# Patient Record
Sex: Male | Born: 1985 | Race: Black or African American | Hispanic: No | Marital: Single | Smoking: Current every day smoker
Health system: Southern US, Community
[De-identification: ages and names within clinical notes are randomized; demographics above are authoritative.]

---
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
## 2006-11-15 ENCOUNTER — Emergency Department (HOSPITAL_COMMUNITY): Admission: EM | Admit: 2006-11-15 | Discharge: 2006-11-16 | Payer: Self-pay | Admitting: Emergency Medicine

## 2008-02-24 ENCOUNTER — Emergency Department (HOSPITAL_COMMUNITY): Admission: EM | Admit: 2008-02-24 | Discharge: 2008-02-24 | Payer: Self-pay | Admitting: Emergency Medicine

## 2008-07-11 ENCOUNTER — Emergency Department (HOSPITAL_COMMUNITY): Admission: EM | Admit: 2008-07-11 | Discharge: 2008-07-11 | Payer: Self-pay | Admitting: Emergency Medicine

## 2009-04-22 ENCOUNTER — Emergency Department (HOSPITAL_COMMUNITY): Admission: EM | Admit: 2009-04-22 | Discharge: 2009-04-22 | Payer: Self-pay | Admitting: Emergency Medicine

## 2010-02-12 ENCOUNTER — Ambulatory Visit (HOSPITAL_BASED_OUTPATIENT_CLINIC_OR_DEPARTMENT_OTHER)
Admission: RE | Admit: 2010-02-12 | Discharge: 2010-02-12 | Payer: Self-pay | Source: Home / Self Care | Attending: Internal Medicine | Admitting: Internal Medicine
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
## 2010-11-16 ENCOUNTER — Emergency Department (HOSPITAL_COMMUNITY): Payer: BC Managed Care – PPO

## 2010-11-16 ENCOUNTER — Emergency Department (HOSPITAL_COMMUNITY)
Admission: EM | Admit: 2010-11-16 | Discharge: 2010-11-16 | Disposition: A | Discharge status: Home / Self Care | Payer: BC Managed Care – PPO | Attending: Emergency Medicine | Admitting: Emergency Medicine

## 2010-11-16 DIAGNOSIS — F17200 Nicotine dependence, unspecified, uncomplicated: Secondary | ICD-10-CM | POA: Insufficient documentation

## 2010-11-16 DIAGNOSIS — W219XXA Striking against or struck by unspecified sports equipment, initial encounter: Secondary | ICD-10-CM | POA: Insufficient documentation

## 2010-11-16 DIAGNOSIS — S40019A Contusion of unspecified shoulder, initial encounter: Secondary | ICD-10-CM | POA: Insufficient documentation

## 2010-11-16 DIAGNOSIS — Y9361 Activity, american tackle football: Secondary | ICD-10-CM | POA: Insufficient documentation

## 2010-11-16 DIAGNOSIS — M25519 Pain in unspecified shoulder: Secondary | ICD-10-CM | POA: Insufficient documentation

## 2010-11-16 DIAGNOSIS — M79609 Pain in unspecified limb: Secondary | ICD-10-CM | POA: Insufficient documentation

## 2011-09-02 ENCOUNTER — Emergency Department (HOSPITAL_COMMUNITY): Payer: BC Managed Care – PPO

## 2011-09-02 ENCOUNTER — Encounter (HOSPITAL_COMMUNITY): Payer: Self-pay

## 2011-09-02 ENCOUNTER — Emergency Department (HOSPITAL_COMMUNITY)
Admission: EM | Admit: 2011-09-02 | Discharge: 2011-09-02 | Disposition: A | Discharge status: Home / Self Care | Payer: BC Managed Care – PPO | Attending: Emergency Medicine | Admitting: Emergency Medicine

## 2011-09-02 DIAGNOSIS — F17200 Nicotine dependence, unspecified, uncomplicated: Secondary | ICD-10-CM | POA: Insufficient documentation

## 2011-09-02 DIAGNOSIS — IMO0002 Reserved for concepts with insufficient information to code with codable children: Secondary | ICD-10-CM | POA: Insufficient documentation

## 2011-09-02 DIAGNOSIS — Y998 Other external cause status: Secondary | ICD-10-CM | POA: Insufficient documentation

## 2011-09-02 DIAGNOSIS — Y92410 Unspecified street and highway as the place of occurrence of the external cause: Secondary | ICD-10-CM | POA: Insufficient documentation

## 2011-09-02 DIAGNOSIS — X58XXXA Exposure to other specified factors, initial encounter: Secondary | ICD-10-CM | POA: Insufficient documentation

## 2011-09-02 DIAGNOSIS — S76319A Strain of muscle, fascia and tendon of the posterior muscle group at thigh level, unspecified thigh, initial encounter: Secondary | ICD-10-CM

## 2011-09-02 DIAGNOSIS — Y9302 Activity, running: Secondary | ICD-10-CM | POA: Insufficient documentation

## 2011-09-02 MED ORDER — IBUPROFEN 400 MG PO TABS
800.0000 mg | ORAL_TABLET | Freq: Once | ORAL | Status: AC
  Administered 2011-09-02: 800 mg via ORAL
  Filled 2011-09-02: qty 2

## 2011-09-02 NOTE — ED Notes (Signed)
Pt presents with pain to L hamstring while running today.  Pt reports feeling "something pop" in his thigh.

## 2011-09-02 NOTE — ED Provider Notes (Signed)
History   This chart was scribed for Henry Chick, MD by Shari Heritage. The patient was seen in room TR11C/TR11C. Patient's care was started at 1303.     CSN: [id number]  Arrival date & time 09/02/11  1303   First MD Initiated Contact with Patient 09/02/11 1331      Chief Complaint  Patient presents with  . Leg Pain    (Consider location/radiation/quality/duration/timing/severity/associated sxs/prior treatment) Patient is a 26 y.o. male presenting with leg pain. The history is provided by the patient. No language interpreter was used.  Leg Pain  The incident occurred 3 to 5 hours ago. The incident occurred in the street. There was no injury mechanism (Patient was running). The pain is present in the left thigh. The pain is moderate. The pain has been constant since onset. He reports no foreign bodies present. He has tried nothing for the symptoms.    Henry Ward is a 26 y.o. male who presents to the Emergency Department complaining of moderate to severe left hamstring pain onset a couple of hours ago. Patient says that he was running this morning when he felt something pop in his leg. Patient denies a fall or obvious injury to left leg. Patient hasn't taken any medications for pain relief. Pain is worse with certain positions.  He is able to bear weight   History reviewed. No pertinent past medical history.  History reviewed. No pertinent past surgical history.  No family history on file.  History  Substance Use Topics  . Smoking status: Current Everyday Smoker -- 0.5 packs/day  . Smokeless tobacco: Not on file  . Alcohol Use: Yes      Review of Systems  Musculoskeletal:       Patient is positive for left leg pain.  All other systems reviewed and are negative.    Allergies  Review of patient's allergies indicates no known allergies.  Home Medications  No current outpatient prescriptions on file.  BP 118/95  Pulse 85  Temp 98.4 F (36.9 C) (Oral)  Resp 18   Ht 6' (1.829 m)  Wt 235 lb (106.595 kg)  BMI 31.87 kg/m2  SpO2 97%  Physical Exam  Nursing note and vitals reviewed. Constitutional: He is oriented to person, place, and time. He appears well-developed and well-nourished. No distress.  HENT:  Head: Normocephalic and atraumatic.  Eyes: EOM are normal. Pupils are equal, round, and reactive to light.  Neck: Neck supple. No tracheal deviation present.  Cardiovascular: Normal rate.   Pulmonary/Chest: Effort normal. No respiratory distress.  Abdominal: Soft. He exhibits no distension.  Musculoskeletal: Normal range of motion. He exhibits no edema.       Full strength with flexion of left hamstring. Able to extend. Knee exam was normal.  Neurological: He is alert and oriented to person, place, and time. No sensory deficit.  Skin: Skin is warm and dry.  Psychiatric: He has a normal mood and affect. His behavior is normal.    ED Course  Procedures (including critical care time) DIAGNOSTIC STUDIES: Oxygen Saturation is 97% on room air, adequate by my interpretation.    COORDINATION OF CARE: 1:31pm- Patient informed of current plan for treatment and evaluation and agrees with plan at this time. Will order X-ray of left femur and administer 800mg  of Ibuprofen for pain.  Labs Reviewed - No data to display  Dg Femur Left  09/02/2011  *RADIOLOGY REPORT*  Clinical Data: Pain while running  LEFT FEMUR - 2 VIEW  Comparison:  None.  Findings: There is no evidence of bone, joint, or soft tissue abnormality.  IMPRESSION: Normal left femur.  Original Report Authenticated By: Brandon Melnick, M.D.     1. Hamstring strain       MDM  Pt with likely hamstring strain, no bulging of hamstring muslce or weakness on exam to suggest rupture.  Pt treated with ibuprofen, Discharged with strict return precautions.  Pt agreeable with plan.  Given information for orthopedics followup    I personally performed the services described in this documentation,  which was scribed in my presence. The recorded information has been reviewed and considered.   Henry Chick, MD 09/03/11 1750

## 2017-10-01 ENCOUNTER — Ambulatory Visit (HOSPITAL_COMMUNITY)
Admission: EM | Admit: 2017-10-01 | Discharge: 2017-10-01 | Disposition: A | Discharge status: Home / Self Care | Payer: BLUE CROSS/BLUE SHIELD | Attending: Family Medicine | Admitting: Family Medicine

## 2017-10-01 ENCOUNTER — Encounter (HOSPITAL_COMMUNITY): Payer: Self-pay | Admitting: Emergency Medicine

## 2017-10-01 DIAGNOSIS — F17210 Nicotine dependence, cigarettes, uncomplicated: Secondary | ICD-10-CM | POA: Diagnosis not present

## 2017-10-01 DIAGNOSIS — Z113 Encounter for screening for infections with a predominantly sexual mode of transmission: Secondary | ICD-10-CM | POA: Insufficient documentation

## 2017-10-01 NOTE — Discharge Instructions (Addendum)
Declines treatment for gonorrhea and chlamydia today.  Would like to wait on results of test Urine cytology obtained HIV/ syphilis ordered today We will follow up with you regarding the results of your test If tests are positive, please abstain from sexual activity for at least 7 days and notify partners [facility] if symptoms persists Return here or go to ER if you have any new or worsening symptoms

## 2017-10-01 NOTE — ED Triage Notes (Signed)
Pt here for STD testing. No symptoms.

## 2017-10-01 NOTE — ED Provider Notes (Signed)
[facility]   [id number] 10/01/17 Arrival Time: 1631   WU:[id number]:CONCERN FOR STD  SUBJECTIVE:  Henry Ward is a 32 y.o. male who presents requesting STI screening.  Last unprotected sexual encounter last week.  Sexually active with 1 male partner.  Currently asymptomatic.  Denies fever, chills, nausea, vomiting, abdominal or urinary symptoms, penile itching, penile pain, testicular pain, testicular swelling, penile or groin rashes or lesions.   No LMP for male patient.  ROS: As per HPI.  History reviewed. No pertinent past medical history. History reviewed. No pertinent surgical history. No Known Allergies No current facility-administered medications on file prior to encounter.    No current outpatient medications on file prior to encounter.   Social History   Socioeconomic History  . Marital status: Single    Spouse name: Not on file  . Number of children: Not on file  . Years of education: Not on file  . Highest education level: Not on file  Occupational History  . Not on file  Social Needs  . Financial resource strain: Not on file  . Food insecurity:    Worry: Not on file    Inability: Not on file  . Transportation needs:    Medical: Not on file    Non-medical: Not on file  Tobacco Use  . Smoking status: Current Every Day Smoker    Packs/day: 0.50  Substance and Sexual Activity  . Alcohol use: Yes  . Drug use: No  . Sexual activity: Not on file  Lifestyle  . Physical activity:    Days per week: Not on file    Minutes per session: Not on file  . Stress: Not on file  Relationships  . Social connections:    Talks on phone: Not on file    Gets together: Not on file    Attends religious service: Not on file    Active member of club or organization: Not on file    Attends meetings of clubs or organizations: Not on file    Relationship status: Not on file  . Intimate partner violence:    Fear of current or ex partner: Not on file    Emotionally  abused: Not on file    Physically abused: Not on file    Forced sexual activity: Not on file  Other Topics Concern  . Not on file  Social History Narrative  . Not on file   No family history on file.  OBJECTIVE:  Vitals:   10/01/17 1659  BP: 124/72  Pulse: 61  Resp: 16  Temp: 97.8 F (36.6 C)  SpO2: 97%     General appearance: alert, cooperative, appears stated age and no distress Throat: lips, mucosa, and tongue normal; teeth and gums normal Lungs: CTA bilaterally without adventitious breath sounds Heart: regular rate and rhythm.  Radial pulses 2+ symmetrical bilaterally Abdomen: soft, non-tender; bowel sounds normal; no guarding GU: deferred Skin: warm and dry Psychological:  Alert and cooperative. Normal mood and affect.  No results found for this or any previous visit.  Labs Reviewed  HIV ANTIBODY (ROUTINE TESTING)  [facility]  URINE CYTOLOGY ANCILLARY ONLY    ASSESSMENT & PLAN:  1. Routine screening for STI (sexually transmitted infection)     No orders of the defined types were placed in this encounter.   Pending: Labs Reviewed  HIV ANTIBODY (ROUTINE TESTING)  [facility]  URINE CYTOLOGY ANCILLARY ONLY   Declines treatment for gonorrhea and chlamydia today.  Would like to wait on  results of tests Urine cytology obtained HIV/ syphilis ordered today We will follow up with you regarding the results of your test If tests are positive, please abstain from sexual activity for at least 7 days and notify partners Follow up with PCP or with [facility] if symptoms persists Return here or go to ER if you have any new or worsening symptoms   Reviewed expectations re: course of current medical issues. Questions answered. Outlined signs and symptoms indicating need for more acute intervention. Patient verbalized understanding. After Visit Summary given.       Rennis Harding, PA-C 10/01/17 1729

## 2017-10-02 LAB — RPR: RPR Ser Ql: NONREACTIVE

## 2017-10-02 LAB — HIV ANTIBODY (ROUTINE TESTING W REFLEX): HIV Screen 4th Generation wRfx: NONREACTIVE

## 2017-10-04 LAB — URINE CYTOLOGY ANCILLARY ONLY
Chlamydia: NEGATIVE
Neisseria Gonorrhea: NEGATIVE
Trichomonas: NEGATIVE

## 2017-11-28 ENCOUNTER — Encounter (HOSPITAL_COMMUNITY): Payer: Self-pay | Admitting: *Deleted

## 2017-11-28 ENCOUNTER — Other Ambulatory Visit: Payer: Self-pay

## 2017-11-28 ENCOUNTER — Emergency Department (HOSPITAL_COMMUNITY)
Admission: EM | Admit: 2017-11-28 | Discharge: 2017-11-28 | Disposition: A | Discharge status: Home / Self Care | Payer: BLUE CROSS/BLUE SHIELD | Attending: Emergency Medicine | Admitting: Emergency Medicine

## 2017-11-28 DIAGNOSIS — K2900 Acute gastritis without bleeding: Secondary | ICD-10-CM | POA: Diagnosis not present

## 2017-11-28 DIAGNOSIS — F17200 Nicotine dependence, unspecified, uncomplicated: Secondary | ICD-10-CM | POA: Insufficient documentation

## 2017-11-28 DIAGNOSIS — R1013 Epigastric pain: Secondary | ICD-10-CM | POA: Diagnosis present

## 2017-11-28 LAB — CBC
HCT: 48 % (ref 39.0–52.0)
Hemoglobin: 15.3 g/dL (ref 13.0–17.0)
MCH: 27.6 pg (ref 26.0–34.0)
MCHC: 31.9 g/dL (ref 30.0–36.0)
MCV: 86.5 fL (ref 80.0–100.0)
Platelets: 281 10*3/uL (ref 150–400)
RBC: 5.55 MIL/uL (ref 4.22–5.81)
RDW: 12.7 % (ref 11.5–15.5)
WBC: 6.3 10*3/uL (ref 4.0–10.5)
nRBC: 0 % (ref 0.0–0.2)

## 2017-11-28 LAB — URINALYSIS, ROUTINE W REFLEX MICROSCOPIC
Bacteria, UA: NONE SEEN
Bilirubin Urine: NEGATIVE
Glucose, UA: NEGATIVE mg/dL
Hgb urine dipstick: NEGATIVE
Ketones, ur: NEGATIVE mg/dL
Leukocytes, UA: NEGATIVE
Nitrite: NEGATIVE
Protein, ur: NEGATIVE mg/dL
Specific Gravity, Urine: 1.013 (ref 1.005–1.030)
pH: 7 (ref 5.0–8.0)

## 2017-11-28 LAB — COMPREHENSIVE METABOLIC PANEL
ALT: 49 U/L — ABNORMAL HIGH (ref 0–44)
AST: 34 U/L (ref 15–41)
Albumin: 4.1 g/dL (ref 3.5–5.0)
Alkaline Phosphatase: 54 U/L (ref 38–126)
Anion gap: 9 (ref 5–15)
BUN: 10 mg/dL (ref 6–20)
CO2: 27 mmol/L (ref 22–32)
Calcium: 10 mg/dL (ref 8.9–10.3)
Chloride: 103 mmol/L (ref 98–111)
Creatinine, Ser: 1.13 mg/dL (ref 0.61–1.24)
GFR calc Af Amer: >60 mL/min (ref >60)
GFR calc non Af Amer: >60 mL/min (ref >60)
Glucose, Bld: 93 mg/dL (ref 70–99)
Potassium: 4.3 mmol/L (ref 3.5–5.1)
Sodium: 139 mmol/L (ref 135–145)
Total Bilirubin: 1 mg/dL (ref 0.3–1.2)
Total Protein: 7 g/dL (ref 6.5–8.1)

## 2017-11-28 LAB — LIPASE, BLOOD: Lipase: 54 U/L — ABNORMAL HIGH (ref 11–51)

## 2017-11-28 MED ORDER — OMEPRAZOLE 20 MG PO CPDR: 20.0000 mg | DELAYED_RELEASE_CAPSULE | Freq: Every day | ORAL | 0 refills | Status: AC

## 2017-11-28 NOTE — ED Provider Notes (Signed)
[facility] EMERGENCY DEPARTMENT Provider Note   CSN: [id number] Arrival date & time: 11/28/17  1125     History   Chief Complaint Chief Complaint  Patient presents with  . Abdominal Pain    HPI Henry Ward is a 32 y.o. male.  HPI   32 year old male presents today with complaints of abdominal pain.  Patient notes over the last 2 months he has had epigastric abdominal pain.  He notes this is worse after eating.  He notes spicy foods and red sauce causes worsening symptoms.  He notes no associated nausea vomiting or indigestion.  He denies any dark or tarry stools.  He denies any fever.  He notes a history of ulcers previously, notes this feels similar.  He notes symptoms are improved with Tums and Pepto-Bismol.   History reviewed. No pertinent past medical history.  There are no active problems to display for this patient.   History reviewed. No pertinent surgical history.      Home Medications    Prior to Admission medications   Medication Sig Start Date End Date Taking? Authorizing Provider  omeprazole (PRILOSEC) 20 MG capsule Take 1 capsule (20 mg total) by mouth daily. 11/28/17   Eyvonne Mechanic, PA-C    Family History History reviewed. No pertinent family history.  Social History Social History   Tobacco Use  . Smoking status: Current Every Day Smoker    Packs/day: 0.50  Substance Use Topics  . Alcohol use: Yes  . Drug use: No     Allergies   Patient has no known allergies.   Review of Systems Review of Systems  All other systems reviewed and are negative.  Physical Exam Updated Vital Signs BP 134/86 (BP Location: Right Arm)   Pulse 62   Temp 97.9 F (36.6 C) (Oral)   Resp 20   SpO2 99%   Physical Exam  Constitutional: He is oriented to person, place, and time. He appears well-developed and well-nourished.  HENT:  Head: Normocephalic and atraumatic.  Eyes: Pupils are equal, round, and reactive to light.  Conjunctivae are normal. Right eye exhibits no discharge. Left eye exhibits no discharge. No scleral icterus.  Neck: Normal range of motion. No JVD present. No tracheal deviation present.  Pulmonary/Chest: Effort normal. No stridor.  Abdominal: Soft. He exhibits no distension and no mass. There is no tenderness. There is no rebound and no guarding. No hernia.  Neurological: He is alert and oriented to person, place, and time. Coordination normal.  Psychiatric: He has a normal mood and affect. His behavior is normal. Judgment and thought content normal.  Nursing note and vitals reviewed.    ED Treatments / Results  Labs (all labs ordered are listed, but only abnormal results are displayed) Labs Reviewed  LIPASE, BLOOD - Abnormal; Notable for the following components:      Result Value   Lipase 54 (*)    All other components within normal limits  COMPREHENSIVE METABOLIC PANEL - Abnormal; Notable for the following components:   ALT 49 (*)    All other components within normal limits  CBC  URINALYSIS, ROUTINE W REFLEX MICROSCOPIC    EKG None  Radiology No results found.  Procedures Procedures (including critical care time)  Medications Ordered in ED Medications - No data to display   Initial Impression / Assessment and Plan / ED Course  I have reviewed the triage vital signs and the nursing notes.  Pertinent labs & imaging results that were available  during my care of the patient were reviewed by me and considered in my medical decision making (see chart for details).     Labs: Lipase, CMP, CBC  Imaging:  Consults:  Therapeutics:  Discharge Meds: Omeprazole  Assessment/Plan: 32 year old male presents today with complaints of abdominal pain likely secondary to gastritis.  Patient will be treated with Prilosec, if discussed the need for primary care follow-up for further testing and evaluation if symptoms do not improve, strict return precautions given, he verbalized  understanding and agreement to today's plan had no further questions or concerns.   Final Clinical Impressions(s) / ED Diagnoses   Final diagnoses:  Acute gastritis without hemorrhage, unspecified gastritis type    ED Discharge Orders         Ordered    omeprazole (PRILOSEC) 20 MG capsule  Daily     11/28/17 1349           Eyvonne Mechanic, PA-C 11/28/17 1349    Vanetta Mulders, MD 11/28/17 2304

## 2017-11-28 NOTE — Discharge Instructions (Signed)
Please read attached information. If you experience any new or worsening signs or symptoms please return to the emergency room for evaluation. Please follow-up with your primary care provider or specialist as discussed. Please use medication prescribed only as directed and discontinue taking if you have any concerning signs or symptoms.   °

## 2017-11-28 NOTE — ED Triage Notes (Signed)
Pt in c/o abdominal pain that is central, symptoms for the last two months, worse after eating, symptoms getting progressively, denies n/v, no distress noted

## 2019-04-16 ENCOUNTER — Encounter (HOSPITAL_COMMUNITY): Payer: Self-pay | Admitting: Emergency Medicine

## 2019-04-16 ENCOUNTER — Other Ambulatory Visit: Payer: Self-pay

## 2019-04-16 ENCOUNTER — Emergency Department (HOSPITAL_COMMUNITY)
Admission: EM | Admit: 2019-04-16 | Discharge: 2019-04-16 | Disposition: A | Discharge status: Home / Self Care | Payer: BC Managed Care – PPO | Attending: Emergency Medicine | Admitting: Emergency Medicine

## 2019-04-16 DIAGNOSIS — M545 Low back pain, unspecified: Secondary | ICD-10-CM

## 2019-04-16 DIAGNOSIS — F17210 Nicotine dependence, cigarettes, uncomplicated: Secondary | ICD-10-CM | POA: Insufficient documentation

## 2019-04-16 DIAGNOSIS — Z79899 Other long term (current) drug therapy: Secondary | ICD-10-CM | POA: Diagnosis not present

## 2019-04-16 MED ORDER — KETOROLAC TROMETHAMINE 30 MG/ML IJ SOLN
30.0000 mg | Freq: Once | INTRAMUSCULAR | Status: AC
  Administered 2019-04-16: 16:00:00 30 mg via INTRAMUSCULAR
  Filled 2019-04-16: qty 1

## 2019-04-16 MED ORDER — IBUPROFEN 600 MG PO TABS: 600.0000 mg | ORAL_TABLET | Freq: Four times a day (QID) | ORAL | 0 refills | Status: DC | PRN

## 2019-04-16 MED ORDER — ACETAMINOPHEN 500 MG PO TABS
1000.0000 mg | ORAL_TABLET | Freq: Once | ORAL | Status: AC
  Administered 2019-04-16: 16:00:00 1000 mg via ORAL
  Filled 2019-04-16: qty 2

## 2019-04-16 MED ORDER — LIDOCAINE 5 % EX PTCH
1.0000 | MEDICATED_PATCH | CUTANEOUS | Status: DC
  Administered 2019-04-16: 16:00:00 1 via TRANSDERMAL
  Filled 2019-04-16 (×2): qty 1

## 2019-04-16 NOTE — ED Provider Notes (Signed)
[facility] EMERGENCY DEPARTMENT Provider Note   CSN: [id number] Arrival date & time: 04/16/19  1453     History Chief Complaint  Patient presents with  . Back Pain    [name] is a 34 y.o. male.  [facility] is a 34 y.o. male who is otherwise healthy, present with right sided back pain. Pain began 3 days ago when he was lifting a 225 lb weight and felt the pain when lowering the weight back to the ground. He went to urgent care and was prescribed a muscle relaxer which he took over the weekend and tried to take it easy but since then pain has worsened.  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  Pt took 400 mg ibuprofen once this morning, but has not tried anything else to treat his pain.  Patient denies any associated numbness tingling or weakness in his lower extremity, no loss of bowel or bladder control or saddle anesthesia.  No associated dysuria or urinary frequency, no hematuria.  He denies any associated abdominal pain.  No fevers or chills.  No history of cancer or IV drug use.  No other aggravating or alleviating factors.        History reviewed. No pertinent past medical history.  There are no problems to display for this patient.   History reviewed. No pertinent surgical history.     No family history on file.  Social History   Tobacco Use  . Smoking status: Current Every Day Smoker    Packs/day: 0.50  Substance Use Topics  . Alcohol use: Yes  . Drug use: No    Home Medications Prior to Admission medications   Medication Sig Start Date End Date Taking? Authorizing Provider  ibuprofen (ADVIL) 600 MG tablet Take 1 tablet (600 mg total) by mouth every 6 (six) hours as needed. 04/16/19   Dartha Lodge, PA-C  omeprazole (PRILOSEC) 20 MG capsule Take 1 capsule (20 mg total) by mouth daily. 11/28/17   Eyvonne Mechanic, PA-C     Allergies    Patient has no known allergies.  Review of Systems   Review of Systems  Constitutional: Negative for chills and fever.  HENT: Negative.   Respiratory: Negative for cough and shortness of breath.   Cardiovascular: Negative for chest pain.  Gastrointestinal: Negative for abdominal pain, constipation, diarrhea, nausea and vomiting.  Genitourinary: Negative for dysuria, flank pain, frequency and hematuria.  Musculoskeletal: Positive for back pain. Negative for arthralgias, gait problem, joint swelling, myalgias and neck pain.  Skin: Negative for color change, rash and wound.  Neurological: Negative for weakness and numbness.    Physical Exam Updated Vital Signs BP 123/80   Temp 98.6 F (37 C) (Oral)   Resp 16   Ht 6' (1.829 m)   Wt 113.4 kg   SpO2 100%   BMI 33.91 kg/m   Physical Exam Vitals and nursing note reviewed.  Constitutional:      General: He is not in acute distress.    Appearance: Normal appearance. He is well-developed and normal weight. He is not ill-appearing or diaphoretic.  HENT:     Head: Atraumatic.  Eyes:     General:        Right eye: No discharge.        Left eye: No discharge.  Cardiovascular:     Pulses:  Radial pulses are 2+ on the right side and 2+ on the left side.       Dorsalis pedis pulses are 2+ on the right side and 2+ on the left side.       Posterior tibial pulses are 2+ on the right side and 2+ on the left side.  Pulmonary:     Effort: Pulmonary effort is normal. No respiratory distress.  Abdominal:     General: Bowel sounds are normal. There is no distension.     Palpations: Abdomen is soft. There is no mass.     Tenderness: There is no abdominal tenderness. There is no guarding.     Comments: Abdomen soft, nondistended, nontender to palpation in all quadrants without guarding or peritoneal signs, no CVA tenderness bilaterally  Musculoskeletal:     Cervical back: Neck supple.     Comments: Tenderness to  palpation over right low back musculature with palpable spasm.  Pain made worse with range of motion of the lower extremities, neg straight leg raise, no radiation into lower extremities  Skin:    General: Skin is warm and dry.     Capillary Refill: Capillary refill takes less than 2 seconds.  Neurological:     Mental Status: He is alert and oriented to person, place, and time.     Comments: Alert, clear speech, following commands. Moving all extremities without difficulty. Bilateral lower extremities with 5/5 strength in proximal and distal muscle groups and with dorsi and plantar flexion. Sensation intact in bilateral lower extremities. 2+ patellar DTRs bilaterally. Ambulatory with steady gait  Psychiatric:        Mood and Affect: Mood normal.        Behavior: Behavior normal.     ED Results / Procedures / Treatments   Labs (all labs ordered are listed, but only abnormal results are displayed) Labs Reviewed - No data to display  EKG None  Radiology No results found.  Procedures Procedures (including critical care time)  Medications Ordered in ED Medications  lidocaine (LIDODERM) 5 % 1 patch (1 patch Transdermal Patch Applied 04/16/19 1627)  ketorolac (TORADOL) 30 MG/ML injection 30 mg (30 mg Intramuscular Given 04/16/19 1626)  acetaminophen (TYLENOL) tablet 1,000 mg (1,000 mg Oral Given 04/16/19 1626)    ED Course  I have reviewed the triage vital signs and the nursing notes.  Pertinent labs & imaging results that were available during my care of the patient were reviewed by me and considered in my medical decision making (see chart for details).    MDM Rules/Calculators/A&P                      Patient with back pain.  No neurological deficits and normal neuro exam.  Patient can walk but states is painful.  No loss of bowel or bladder control.  No concern for cauda equina.  No fever, night sweats, weight loss, h/o cancer, IVDU.  RICE protocol and pain medicine  indicated and discussed with patient.   Final Clinical Impression(s) / ED Diagnoses Final diagnoses:  Acute right-sided low back pain without sciatica    Rx / DC Orders ED Discharge Orders         Ordered    ibuprofen (ADVIL) 600 MG tablet  Every 6 hours PRN     04/16/19 1741           Jacqlyn Larsen, PA-C 04/16/19 1742    Isla Pence, MD 04/16/19 2318

## 2019-04-16 NOTE — Discharge Instructions (Addendum)
You were seen here today for Back Pain: Low back pain is discomfort in the lower back that may be due to injuries to muscles and ligaments around the spine. Occasionally, it may be caused by a problem to a part of the spine called a disc. Your back pain should be treated with medicines listed below as well as back exercises and this back pain should get better over the next 2 weeks. Most patients get completely well in 4 weeks. It is important to know however, if you develop severe or worsening pain, low back pain with fever, numbness, weakness or inability to walk or urinate, you should return to the ER immediately.  Please follow up with your doctor this week for a recheck if still having symptoms.  HOME INSTRUCTIONS Self - care:  The application of heat can help soothe the pain.  Maintaining your daily activities, including walking (this is encouraged), as it will help you get better faster than just staying in bed. Do not life, push, pull anything more than 10 pounds for the next week. I am attaching back exercises that you can do at home to help facilitate your recovery.   Back Exercises - I have attached a handout on back exercises that can be done at home to help facilitate your recovery.   Medications are also useful to help with pain control.   Acetaminophen.  This medication is generally safe, and found over the counter. Take as directed for your age. You should not take more than 8 of the extra strength (500mg ) pills a day (max dose is 4000mg  total OVER one day)  Non steroidal anti inflammatory: This includes medications including Ibuprofen, naproxen and Mobic; These medications help both pain and swelling and are very useful in treating back pain.  They should be taken with food, as they can cause stomach upset, and more seriously, stomach bleeding. Do not combine the medications.   Lidocaine Patch: Salon Pas lidocaine patches (blue and silver box) can be purchased over the counter and worn  for 12 hours for local pain relief   Muscle relaxants:  These medications can help with muscle tightness that is a cause of lower back pain.  Most of these medications can cause drowsiness, and it is not safe to drive or use dangerous machinery while taking them. They are primarily helpful when taken at night before sleep.  You will need to follow up with your primary healthcare provider or the Orthopedist in 1-2 weeks for reassessment and persistent symptoms.  Be aware that if you develop new symptoms, such as a fever, leg weakness, difficulty with or loss of control of your urine or bowels, abdominal pain, or more severe pain, you will need to seek medical attention and/or return to the Emergency department. Additional Information:  Your vital signs today were: BP 123/80   Temp 98.6 F (37 C) (Oral)   Resp 16   Ht 6' (1.829 m)   Wt 113.4 kg   SpO2 100%   BMI 33.91 kg/m  If your blood pressure (BP) was elevated above 135/85 this visit, please have this repeated by your doctor within one month. ---------------

## 2019-04-16 NOTE — ED Triage Notes (Signed)
Onset 3 days ago developed lower back pain while working out. Seen at Urgent Care same day and given muscle relaxer. Went to work today pain continued currently 8/10 achy sharp.

## 2021-02-26 DIAGNOSIS — G4733 Obstructive sleep apnea (adult) (pediatric): Secondary | ICD-10-CM | POA: Diagnosis not present

## 2021-03-29 DIAGNOSIS — G4733 Obstructive sleep apnea (adult) (pediatric): Secondary | ICD-10-CM | POA: Diagnosis not present

## 2021-04-26 DIAGNOSIS — G4733 Obstructive sleep apnea (adult) (pediatric): Secondary | ICD-10-CM | POA: Diagnosis not present

## 2021-05-27 DIAGNOSIS — G4733 Obstructive sleep apnea (adult) (pediatric): Secondary | ICD-10-CM | POA: Diagnosis not present

## 2021-06-26 DIAGNOSIS — G4733 Obstructive sleep apnea (adult) (pediatric): Secondary | ICD-10-CM | POA: Diagnosis not present

## 2021-07-08 ENCOUNTER — Emergency Department (HOSPITAL_COMMUNITY)
Admission: EM | Admit: 2021-07-08 | Discharge: 2021-07-09 | Discharge status: Against Medical Advice | Payer: BC Managed Care – PPO | Attending: Student | Admitting: Student

## 2021-07-08 ENCOUNTER — Other Ambulatory Visit: Payer: Self-pay

## 2021-07-08 ENCOUNTER — Emergency Department (HOSPITAL_COMMUNITY): Payer: BC Managed Care – PPO

## 2021-07-08 ENCOUNTER — Encounter (HOSPITAL_COMMUNITY): Payer: Self-pay | Admitting: Emergency Medicine

## 2021-07-08 DIAGNOSIS — M546 Pain in thoracic spine: Secondary | ICD-10-CM | POA: Diagnosis not present

## 2021-07-08 DIAGNOSIS — R519 Headache, unspecified: Secondary | ICD-10-CM | POA: Diagnosis not present

## 2021-07-08 DIAGNOSIS — M545 Low back pain, unspecified: Secondary | ICD-10-CM | POA: Insufficient documentation

## 2021-07-08 DIAGNOSIS — Z5321 Procedure and treatment not carried out due to patient leaving prior to being seen by health care provider: Secondary | ICD-10-CM | POA: Insufficient documentation

## 2021-07-08 DIAGNOSIS — M542 Cervicalgia: Secondary | ICD-10-CM | POA: Diagnosis not present

## 2021-07-08 DIAGNOSIS — S3992XA Unspecified injury of lower back, initial encounter: Secondary | ICD-10-CM | POA: Diagnosis not present

## 2021-07-08 DIAGNOSIS — Y92410 Unspecified street and highway as the place of occurrence of the external cause: Secondary | ICD-10-CM | POA: Insufficient documentation
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
## 2021-07-09 ENCOUNTER — Emergency Department (HOSPITAL_COMMUNITY): Payer: BC Managed Care – PPO

## 2021-07-09 ENCOUNTER — Encounter (HOSPITAL_COMMUNITY): Payer: Self-pay | Admitting: Emergency Medicine

## 2021-07-09 ENCOUNTER — Other Ambulatory Visit: Payer: Self-pay

## 2021-07-09 ENCOUNTER — Emergency Department (HOSPITAL_COMMUNITY)
Admission: EM | Admit: 2021-07-09 | Discharge: 2021-07-09 | Disposition: A | Discharge status: Home / Self Care | Payer: BC Managed Care – PPO | Source: Home / Self Care | Attending: Emergency Medicine | Admitting: Emergency Medicine

## 2021-07-09 DIAGNOSIS — M546 Pain in thoracic spine: Secondary | ICD-10-CM | POA: Insufficient documentation

## 2021-07-09 DIAGNOSIS — S3992XA Unspecified injury of lower back, initial encounter: Secondary | ICD-10-CM | POA: Diagnosis not present

## 2021-07-09 DIAGNOSIS — Y92410 Unspecified street and highway as the place of occurrence of the external cause: Secondary | ICD-10-CM | POA: Insufficient documentation

## 2021-07-09 DIAGNOSIS — M545 Low back pain, unspecified: Secondary | ICD-10-CM | POA: Insufficient documentation

## 2021-07-09 MED ORDER — IBUPROFEN 800 MG PO TABS: 800.0000 mg | ORAL_TABLET | Freq: Three times a day (TID) | ORAL | 0 refills | Status: AC

## 2021-07-09 MED ORDER — LIDOCAINE 5 % EX PTCH: 1.0000 | MEDICATED_PATCH | CUTANEOUS | 0 refills | Status: AC
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
## 2021-07-09 NOTE — Discharge Instructions (Addendum)
To take ibuprofen every 8 hours as needed for pain.  Use the Lidoderm patch for support.
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
## 2021-07-15 DIAGNOSIS — Z720 Tobacco use: Secondary | ICD-10-CM | POA: Diagnosis not present

## 2021-07-15 DIAGNOSIS — M549 Dorsalgia, unspecified: Secondary | ICD-10-CM | POA: Diagnosis not present

## 2021-08-14 DIAGNOSIS — M5126 Other intervertebral disc displacement, lumbar region: Secondary | ICD-10-CM | POA: Diagnosis not present

## 2021-08-24 DIAGNOSIS — M545 Low back pain, unspecified: Secondary | ICD-10-CM | POA: Diagnosis not present

## 2021-08-24 DIAGNOSIS — M791 Myalgia, unspecified site: Secondary | ICD-10-CM | POA: Diagnosis not present

## 2021-08-24 DIAGNOSIS — M2560 Stiffness of unspecified joint, not elsewhere classified: Secondary | ICD-10-CM | POA: Diagnosis not present

## 2021-08-28 DIAGNOSIS — Z0000 Encounter for general adult medical examination without abnormal findings: Secondary | ICD-10-CM | POA: Diagnosis not present

## 2021-08-28 DIAGNOSIS — R1013 Epigastric pain: Secondary | ICD-10-CM | POA: Diagnosis not present

## 2021-08-28 DIAGNOSIS — R748 Abnormal levels of other serum enzymes: Secondary | ICD-10-CM | POA: Diagnosis not present

## 2021-08-28 DIAGNOSIS — Z13220 Encounter for screening for lipoid disorders: Secondary | ICD-10-CM | POA: Diagnosis not present

## 2021-09-11 DIAGNOSIS — G4733 Obstructive sleep apnea (adult) (pediatric): Secondary | ICD-10-CM | POA: Diagnosis not present

## 2021-10-09 DIAGNOSIS — R748 Abnormal levels of other serum enzymes: Secondary | ICD-10-CM | POA: Diagnosis not present

## 2021-10-09 DIAGNOSIS — M5126 Other intervertebral disc displacement, lumbar region: Secondary | ICD-10-CM | POA: Diagnosis not present

## 2021-10-12 DIAGNOSIS — G4733 Obstructive sleep apnea (adult) (pediatric): Secondary | ICD-10-CM | POA: Diagnosis not present

## 2021-10-22 ENCOUNTER — Other Ambulatory Visit: Payer: Self-pay | Admitting: Internal Medicine

## 2021-10-22 DIAGNOSIS — R748 Abnormal levels of other serum enzymes: Secondary | ICD-10-CM

## 2021-10-23 ENCOUNTER — Ambulatory Visit
Admission: RE | Admit: 2021-10-23 | Discharge: 2021-10-23 | Disposition: A | Discharge status: Home / Self Care | Payer: BC Managed Care – PPO | Source: Ambulatory Visit | Attending: Internal Medicine | Admitting: Internal Medicine

## 2021-10-23 DIAGNOSIS — R945 Abnormal results of liver function studies: Secondary | ICD-10-CM | POA: Diagnosis not present

## 2021-10-23 DIAGNOSIS — R748 Abnormal levels of other serum enzymes: Secondary | ICD-10-CM

## 2021-11-06 DIAGNOSIS — M5126 Other intervertebral disc displacement, lumbar region: Secondary | ICD-10-CM | POA: Diagnosis not present

## 2021-11-11 DIAGNOSIS — G4733 Obstructive sleep apnea (adult) (pediatric): Secondary | ICD-10-CM | POA: Diagnosis not present

## 2022-03-31 DIAGNOSIS — G4733 Obstructive sleep apnea (adult) (pediatric): Secondary | ICD-10-CM | POA: Diagnosis not present

## 2022-04-29 DIAGNOSIS — G4733 Obstructive sleep apnea (adult) (pediatric): Secondary | ICD-10-CM | POA: Diagnosis not present

## 2022-05-30 DIAGNOSIS — G4733 Obstructive sleep apnea (adult) (pediatric): Secondary | ICD-10-CM | POA: Diagnosis not present

## 2022-07-13 ENCOUNTER — Other Ambulatory Visit: Payer: Self-pay

## 2022-07-13 ENCOUNTER — Emergency Department (HOSPITAL_COMMUNITY)
Admission: EM | Admit: 2022-07-13 | Discharge: 2022-07-13 | Disposition: A | Discharge status: Home / Self Care | Payer: BC Managed Care – PPO | Attending: Emergency Medicine | Admitting: Emergency Medicine

## 2022-07-13 ENCOUNTER — Emergency Department (HOSPITAL_COMMUNITY): Payer: BC Managed Care – PPO

## 2022-07-13 DIAGNOSIS — S39012A Strain of muscle, fascia and tendon of lower back, initial encounter: Secondary | ICD-10-CM | POA: Insufficient documentation

## 2022-07-13 DIAGNOSIS — S161XXA Strain of muscle, fascia and tendon at neck level, initial encounter: Secondary | ICD-10-CM | POA: Diagnosis not present

## 2022-07-13 DIAGNOSIS — Y92410 Unspecified street and highway as the place of occurrence of the external cause: Secondary | ICD-10-CM | POA: Insufficient documentation

## 2022-07-13 DIAGNOSIS — M5136 Other intervertebral disc degeneration, lumbar region: Secondary | ICD-10-CM | POA: Diagnosis not present

## 2022-07-13 DIAGNOSIS — M545 Low back pain, unspecified: Secondary | ICD-10-CM | POA: Diagnosis not present

## 2022-07-13 MED ORDER — CYCLOBENZAPRINE HCL 10 MG PO TABS: 10.0000 mg | ORAL_TABLET | Freq: Two times a day (BID) | ORAL | 0 refills | Status: AC | PRN

## 2022-07-13 MED ORDER — ACETAMINOPHEN 500 MG PO TABS
1000.0000 mg | ORAL_TABLET | Freq: Once | ORAL | Status: AC
Start: 2022-07-13 — End: 2022-07-13
  Administered 2022-07-13: 1000 mg via ORAL
  Filled 2022-07-13: qty 2

## 2022-07-13 MED ORDER — IBUPROFEN 400 MG PO TABS
600.0000 mg | ORAL_TABLET | Freq: Once | ORAL | Status: AC
  Administered 2022-07-13: 600 mg via ORAL
  Filled 2022-07-13: qty 1
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
## 2022-07-13 NOTE — Discharge Instructions (Signed)
Please follow-up with your primary care provider recent symptoms and ER visit.  Today CT scan today of degenerative disc disease however this does not appear new and you may follow-up with her primary care about this.  You may take Tylenol ibuprofen every 6 hours as needed for pain.  I have also prescribed for you Flexeril to take.  This is a muscle relaxer that is very sedating so please do not operate machinery or drive after this.  I also attached a work note for you.  If symptoms worsen or change please return to ER.
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
## 2022-07-16 DIAGNOSIS — M545 Low back pain, unspecified: Secondary | ICD-10-CM | POA: Diagnosis not present

## 2022-07-21 DIAGNOSIS — G4733 Obstructive sleep apnea (adult) (pediatric): Secondary | ICD-10-CM | POA: Diagnosis not present

## 2022-08-02 DIAGNOSIS — M545 Low back pain, unspecified: Secondary | ICD-10-CM | POA: Diagnosis not present

## 2022-08-20 DIAGNOSIS — G4733 Obstructive sleep apnea (adult) (pediatric): Secondary | ICD-10-CM | POA: Diagnosis not present

## 2022-09-20 DIAGNOSIS — G4733 Obstructive sleep apnea (adult) (pediatric): Secondary | ICD-10-CM | POA: Diagnosis not present

## 2022-09-21 DIAGNOSIS — Z13220 Encounter for screening for lipoid disorders: Secondary | ICD-10-CM | POA: Diagnosis not present

## 2022-09-21 DIAGNOSIS — Z113 Encounter for screening for infections with a predominantly sexual mode of transmission: Secondary | ICD-10-CM | POA: Diagnosis not present

## 2022-09-21 DIAGNOSIS — R748 Abnormal levels of other serum enzymes: Secondary | ICD-10-CM | POA: Diagnosis not present

## 2022-09-21 DIAGNOSIS — Z0000 Encounter for general adult medical examination without abnormal findings: Secondary | ICD-10-CM | POA: Diagnosis not present

## 2022-10-22 DIAGNOSIS — G4733 Obstructive sleep apnea (adult) (pediatric): Secondary | ICD-10-CM | POA: Diagnosis not present

## 2022-11-21 DIAGNOSIS — G4733 Obstructive sleep apnea (adult) (pediatric): Secondary | ICD-10-CM | POA: Diagnosis not present

## 2022-12-22 DIAGNOSIS — G4733 Obstructive sleep apnea (adult) (pediatric): Secondary | ICD-10-CM | POA: Diagnosis not present
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
## 2024-01-20 ENCOUNTER — Other Ambulatory Visit: Payer: Self-pay

## 2024-01-20 ENCOUNTER — Emergency Department (HOSPITAL_COMMUNITY): Admission: EM | Admit: 2024-01-20 | Discharge: 2024-01-20 | Discharge status: Against Medical Advice

## 2024-01-20 ENCOUNTER — Encounter (HOSPITAL_COMMUNITY): Payer: Self-pay | Admitting: Emergency Medicine

## 2024-01-20 ENCOUNTER — Emergency Department (HOSPITAL_COMMUNITY)

## 2024-01-20 DIAGNOSIS — R42 Dizziness and giddiness: Secondary | ICD-10-CM | POA: Diagnosis not present

## 2024-01-20 DIAGNOSIS — Z5321 Procedure and treatment not carried out due to patient leaving prior to being seen by health care provider: Secondary | ICD-10-CM | POA: Diagnosis not present

## 2024-01-20 DIAGNOSIS — T675XXA Heat exhaustion, unspecified, initial encounter: Secondary | ICD-10-CM | POA: Diagnosis not present

## 2024-01-20 DIAGNOSIS — M62830 Muscle spasm of back: Secondary | ICD-10-CM | POA: Insufficient documentation

## 2024-01-20 LAB — CK: Total CK: 197 U/L (ref 49–397)

## 2024-01-20 LAB — COMPREHENSIVE METABOLIC PANEL WITH GFR
ALT: 64 U/L — ABNORMAL HIGH (ref 0–44)
AST: 37 U/L (ref 15–41)
Albumin: 4.5 g/dL (ref 3.5–5.0)
Alkaline Phosphatase: 68 U/L (ref 38–126)
Anion gap: 10 (ref 5–15)
BUN: 11 mg/dL (ref 6–20)
CO2: 24 mmol/L (ref 22–32)
Calcium: 9.6 mg/dL (ref 8.9–10.3)
Chloride: 104 mmol/L (ref 98–111)
Creatinine, Ser: 1.09 mg/dL (ref 0.61–1.24)
GFR, Estimated: >60 mL/min
Glucose, Bld: 85 mg/dL (ref 70–99)
Potassium: 4.5 mmol/L (ref 3.5–5.1)
Sodium: 138 mmol/L (ref 135–145)
Total Bilirubin: 0.7 mg/dL (ref 0.0–1.2)
Total Protein: 7.5 g/dL (ref 6.5–8.1)

## 2024-01-20 LAB — CBC WITH DIFFERENTIAL/PLATELET
Abs Immature Granulocytes: 0.02 K/uL (ref 0.00–0.07)
Basophils Absolute: 0 K/uL (ref 0.0–0.1)
Basophils Relative: 1 %
Eosinophils Absolute: 0.1 K/uL (ref 0.0–0.5)
Eosinophils Relative: 2 %
HCT: 48.1 % (ref 39.0–52.0)
Hemoglobin: 16.1 g/dL (ref 13.0–17.0)
Immature Granulocytes: 0 %
Lymphocytes Relative: 36 %
Lymphs Abs: 2.7 K/uL (ref 0.7–4.0)
MCH: 29.5 pg (ref 26.0–34.0)
MCHC: 33.5 g/dL (ref 30.0–36.0)
MCV: 88.1 fL (ref 80.0–100.0)
Monocytes Absolute: 0.5 K/uL (ref 0.1–1.0)
Monocytes Relative: 6 %
Neutro Abs: 4.2 K/uL (ref 1.7–7.7)
Neutrophils Relative %: 55 %
Platelets: 255 K/uL (ref 150–400)
RBC: 5.46 MIL/uL (ref 4.22–5.81)
RDW: 13.4 % (ref 11.5–15.5)
WBC: 7.6 K/uL (ref 4.0–10.5)
nRBC: 0 % (ref 0.0–0.2)

## 2024-01-20 LAB — D-DIMER, QUANTITATIVE: D-Dimer, Quant: <0.27 ug{FEU}/mL (ref 0.00–0.50)

## 2024-01-20 LAB — RESP PANEL BY RT-PCR (RSV, FLU A&B, COVID)  RVPGX2
Influenza A by PCR: NEGATIVE
Influenza B by PCR: NEGATIVE
Resp Syncytial Virus by PCR: NEGATIVE
SARS Coronavirus 2 by RT PCR: NEGATIVE

## 2024-01-20 LAB — TROPONIN T, HIGH SENSITIVITY: Troponin T High Sensitivity: <15 ng/L (ref 0–19)

## 2024-01-20 MED ORDER — CYCLOBENZAPRINE HCL 10 MG PO TABS
5.0000 mg | ORAL_TABLET | Freq: Once | ORAL | Status: AC
  Administered 2024-01-20: 5 mg via ORAL
  Filled 2024-01-20: qty 1

## 2024-01-20 NOTE — ED Triage Notes (Signed)
 Pt BIb [facility] from sork at [employer] for Lower back spasms with dizziness and overheating.  Has been sick with cold symptoms x 1 week.  20G L. AC, 300 mL NS  139/90 hr 74, CBG 102 96% RA
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
## 2024-01-20 NOTE — ED Provider Triage Note (Signed)
 Emergency Medicine Provider Triage Evaluation Note  Henry Ward , a 38 y.o. male  was evaluated in triage.  Pt complains of left side pain and SOB.  Patient picked up his daughter yesterday and felt pain in the left lower ribs and mid back.  Patient also has some shortness of breath.  Patient works for [employer] and picks up heavy things all the time.  Patient also feels lightheaded and dizzy and has not drank that much fluid today.  Review of Systems  Positive: Left rib and upper back pain Negative:   Physical Exam  BP 116/83 (BP Location: Right Arm)   Pulse 75   Temp 97.7 F (36.5 C)   Resp 18   SpO2 97%  Gen:   Awake, no distress   Resp:  Normal effort  MSK:   Moves extremities without difficulty  Other:  Tenderness in the left ribs and parathoracic area.  No CVA tenderness  Medical Decision Making  Medically screening exam initiated at 3:43 PM.  Appropriate orders placed.  Henry Ward was informed that the remainder of the evaluation will be completed by another provider, this initial triage assessment does not replace that evaluation, and the importance of remaining in the ED until their evaluation is complete.  Henry Ward is a 37 y.o. male here presenting with left rib pain and upper back pain after picking up his daughter.  Patient also has not been drinking much and may be slightly dehydrated.  I have ordered CBC CMP and troponin and D-dimer and rib x-ray and thoracic x-ray and CK level.  Patient stable to wait in the waiting room    Patt Alm Macho, MD 01/20/24 [phone]

## 2024-01-21 ENCOUNTER — Ambulatory Visit
Admission: EM | Admit: 2024-01-21 | Discharge: 2024-01-21 | Disposition: A | Discharge status: Home / Self Care | Attending: Emergency Medicine | Admitting: Emergency Medicine

## 2024-01-21 ENCOUNTER — Encounter: Payer: Self-pay | Admitting: Emergency Medicine

## 2024-01-21 DIAGNOSIS — J209 Acute bronchitis, unspecified: Secondary | ICD-10-CM | POA: Diagnosis not present

## 2024-01-21 MED ORDER — PREDNISONE 10 MG PO TABS: 40.0000 mg | ORAL_TABLET | Freq: Every day | ORAL | 0 refills | Status: AC

## 2024-01-21 MED ORDER — AZITHROMYCIN 250 MG PO TABS: 250.0000 mg | ORAL_TABLET | Freq: Every day | ORAL | 0 refills | Status: AC

## 2024-01-21 MED ORDER — PROMETHAZINE-DM 6.25-15 MG/5ML PO SYRP: 5.0000 mL | ORAL_SOLUTION | Freq: Four times a day (QID) | ORAL | 0 refills | Status: AC | PRN

## 2024-01-21 NOTE — Discharge Instructions (Signed)
 You were seen in urgent care today for concerns of cough and rib pain.  Your workup from the emergency department yesterday was thankfully reassuring and it does appear that he currently has bronchitis that is developed.  I started on a course of prescription cough medication, steroids, and antibiotics.  Please take this as prescribed.  For any concerns of new or worsening symptoms, return to urgent care or seek evaluation at the nearest emergency department.

## 2024-01-21 NOTE — ED Triage Notes (Signed)
 Pt states he was sick with cough congestion 1 week ago. He began having left back rib pain 2 days ago. Yesterday developed SOB and went to ER.

## 2024-01-21 NOTE — ED Provider Notes (Signed)
 " [facility]    CSN: [id number] Arrival date & time: 01/21/24  1014      History   Chief Complaint Chief Complaint  Patient presents with   Cough   Rib Injury    HPI AHMET SCHANK is a 38 y.o. male.  Patient presents to urgent care today with concerns of cough and congestion for about 1 week.  Reports he began experience left-sided rib pain for about 2 days.  Reports he developed shortness of breath and went to the ER for evaluation.  He reports that he was unable to be seen due to long wait times in the ER and has now come in today for evaluation and follow-up on these results.  He denies any hemoptysis, fever, chills, or bodyaches.   Cough   History reviewed. No pertinent past medical history.  There are no active problems to display for this patient.   History reviewed. No pertinent surgical history.     Home Medications    Prior to Admission medications  Medication Sig Start Date End Date Taking? Authorizing Provider  azithromycin  (ZITHROMAX ) 250 MG tablet Take 1 tablet (250 mg total) by mouth daily. Take first 2 tablets together, then 1 every day until finished. 01/21/24  Yes Aubert Choyce A, PA-C  predniSONE  (DELTASONE ) 10 MG tablet Take 4 tablets (40 mg total) by mouth daily for 5 days. 01/21/24 01/26/24 Yes Abri Vacca A, PA-C  promethazine -dextromethorphan (PROMETHAZINE -DM) 6.25-15 MG/5ML syrup Take 5 mLs by mouth 4 (four) times daily as needed for cough. 01/21/24  Yes Deshaun Weisinger A, PA-C  cyclobenzaprine  (FLEXERIL ) 10 MG tablet Take 1 tablet (10 mg total) by mouth 2 (two) times daily as needed for muscle spasms. 07/13/22   Victor Lynwood DASEN, PA-C  ibuprofen  (ADVIL ) 800 MG tablet Take 1 tablet (800 mg total) by mouth 3 (three) times daily. 07/09/21   Emelia Sluder, PA-C  lidocaine  (LIDODERM ) 5 % Place 1 patch onto the skin daily. Remove & Discard patch within 12 hours or as directed by MD 07/09/21   Emelia Sluder, PA-C  omeprazole  (PRILOSEC) 20 MG capsule  Take 1 capsule (20 mg total) by mouth daily. 11/28/17   Palmer Purchase, PA-C    Family History History reviewed. No pertinent family history.  Social History Social History[1]   Allergies   Patient has no known allergies.   Review of Systems Review of Systems  Respiratory:  Positive for cough.   All other systems reviewed and are negative.    Physical Exam Triage Vital Signs ED Triage Vitals  Encounter Vitals Group     BP 01/21/24 1033 112/78     Girls Systolic BP Percentile --      Girls Diastolic BP Percentile --      Boys Systolic BP Percentile --      Boys Diastolic BP Percentile --      Pulse Rate 01/21/24 1033 81     Resp 01/21/24 1033 16     Temp 01/21/24 1033 98.4 F (36.9 C)     Temp Source 01/21/24 1033 Oral     SpO2 01/21/24 1033 94 %     Weight --      Height --      Head Circumference --      Peak Flow --      Pain Score 01/21/24 1047 6     Pain Loc --      Pain Education --      Exclude from Growth Chart --  No data found.  Updated Vital Signs BP 112/78 (BP Location: Right Arm)   Pulse 81   Temp 98.4 F (36.9 C) (Oral)   Resp 16   SpO2 94%   Visual Acuity Right Eye Distance:   Left Eye Distance:   Bilateral Distance:    Right Eye Near:   Left Eye Near:    Bilateral Near:     Physical Exam Vitals and nursing note reviewed.  Constitutional:      General: He is not in acute distress.    Appearance: He is well-developed.  HENT:     Head: Normocephalic and atraumatic.  Eyes:     Conjunctiva/sclera: Conjunctivae normal.  Cardiovascular:     Rate and Rhythm: Normal rate and regular rhythm.     Heart sounds: No murmur heard. Pulmonary:     Effort: Pulmonary effort is normal. No respiratory distress.     Breath sounds: Normal breath sounds. No wheezing or rales.  Abdominal:     General: Abdomen is flat. Bowel sounds are normal.     Palpations: Abdomen is soft.     Tenderness: There is no abdominal tenderness.  Musculoskeletal:         General: No swelling.     Cervical back: Neck supple.  Skin:    General: Skin is warm and dry.     Capillary Refill: Capillary refill takes less than 2 seconds.  Neurological:     Mental Status: He is alert.  Psychiatric:        Mood and Affect: Mood normal.      UC Treatments / Results  Labs (all labs ordered are listed, but only abnormal results are displayed) Labs Reviewed - No data to display  EKG   Radiology DG Thoracic Spine 2 View Result Date: 01/20/2024 CLINICAL DATA:  Upper back pain. EXAM: THORACIC SPINE 2 VIEWS COMPARISON:  07/08/2021 FINDINGS: Twelve rib-bearing thoracic vertebra. The alignment is maintained. Vertebral body heights are maintained. No significant disc space narrowing. No evidence of fracture or focal bone abnormality. There is no paravertebral soft tissue abnormality. IMPRESSION: Negative radiographs of the thoracic spine. Electronically Signed   By: Andrea Gasman M.D.   On: 01/20/2024 17:32   DG Chest 2 View Result Date: 01/20/2024 CLINICAL DATA:  Cough and congestion.  Shortness of breath.  Pain. EXAM: CHEST - 2 VIEW; LEFT RIBS - 2 VIEW COMPARISON:  None Available. FINDINGS: Chest: The cardiomediastinal contours are normal. Central bronchial thickening. Pulmonary vasculature is normal. No consolidation, pleural effusion, or pneumothorax. No acute osseous abnormalities are seen. Left ribs: No rib fracture. The cortical margins of the ribs are intact. No evidence of focal lesion or rib destruction. IMPRESSION: 1. Central bronchial thickening, can be seen with bronchitis or asthma. 2. Unremarkable appearance of the left ribs. Electronically Signed   By: Andrea Gasman M.D.   On: 01/20/2024 17:31   DG Ribs Unilateral Left Result Date: 01/20/2024 CLINICAL DATA:  Cough and congestion.  Shortness of breath.  Pain. EXAM: CHEST - 2 VIEW; LEFT RIBS - 2 VIEW COMPARISON:  None Available. FINDINGS: Chest: The cardiomediastinal contours are normal. Central  bronchial thickening. Pulmonary vasculature is normal. No consolidation, pleural effusion, or pneumothorax. No acute osseous abnormalities are seen. Left ribs: No rib fracture. The cortical margins of the ribs are intact. No evidence of focal lesion or rib destruction. IMPRESSION: 1. Central bronchial thickening, can be seen with bronchitis or asthma. 2. Unremarkable appearance of the left ribs. Electronically Signed   By:  Andrea Gasman M.D.   On: 01/20/2024 17:31    Procedures Procedures (including critical care time)  Medications Ordered in UC Medications - No data to display  Initial Impression / Assessment and Plan / UC Course  I have reviewed the triage vital signs and the nursing notes.  Pertinent labs & imaging results that were available during my care of the patient were reviewed by me and considered in my medical decision making (see chart for details).     This patient presents to the UC for concern of cough, rib pain.  Differential diagnosis includes bronchitis, pneumonia, asthma, viral URI    Additional history obtained:  Additional history obtained from chart review   Lab Tests:  I Ordered, and personally interpreted labs.  The pertinent results include: ED lab results from 01/20/2024 show unremarkable findings on CBC, CMP, CK, troponin, and D-dimer.  Also negative for COVID-19, influenza, RSV.   Imaging Studies ordered:  I ordered imaging studies including x-rays of the chest, thoracic spine, and left ribs dated 01/20/2024 from the emergency department I independently visualized and interpreted imaging which showed negative for any acute obvious traumatic injuries.  Bronchitic changes are seen with central bronchial thickening. I agree with the radiologist interpretation  Problem List /UC course:  Patient without significant medical history presents urgent care today with concerns of cough.  Reports he has been having a cough with slight production for about 1  week.  Reports left-sided back pain due to his coughing.  States he had some shortness of breath yesterday when to the ER for evaluation but was unable to be seen due to wait times.  He had imaging and labs performed and is here for evaluation and follow-up. Lab reviewed from yesterday shows unremarkable findings with a negative D-dimer, negative troponin, negative CK, and unremarkable CBC and CMP.  He was also negative for COVID-19, influenza, RSV.  Chest x-ray is negative for any obvious findings to suggest rib injury but there are changes of central bronchial thickening, likely bronchitis. Physical exam today shows no obvious wheezing, rales, rhonchi.  There are some slight tenderness palpation over the left posterior ribs.  No visible lesions or lacerations or rash in this area. Given reassuring workup in the emergency department yesterday, I suspect this is likely a bronchitis episode.  Will start patient on course of cough medication, steroids, and a short course of antibiotics try to help manage his symptoms.  Return precautions discussed such as concerns for new or worsening symptoms.  He is otherwise stable for outpatient follow-up and discharged home.   Social Determinants of Health:  None  Final Clinical Impressions(s) / UC Diagnoses   Final diagnoses:  Acute bronchitis, unspecified organism     Discharge Instructions      You were seen in urgent care today for concerns of cough and rib pain.  Your workup from the emergency department yesterday was thankfully reassuring and it does appear that he currently has bronchitis that is developed.  I started on a course of prescription cough medication, steroids, and antibiotics.  Please take this as prescribed.  For any concerns of new or worsening symptoms, return to urgent care or seek evaluation at the nearest emergency department.     ED Prescriptions     Medication Sig Dispense Auth. Provider   promethazine -dextromethorphan  (PROMETHAZINE -DM) 6.25-15 MG/5ML syrup Take 5 mLs by mouth 4 (four) times daily as needed for cough. 118 mL Dorrene Bently A, PA-C   predniSONE  (DELTASONE ) 10  MG tablet Take 4 tablets (40 mg total) by mouth daily for 5 days. 20 tablet Rumor Sun A, PA-C   azithromycin  (ZITHROMAX ) 250 MG tablet Take 1 tablet (250 mg total) by mouth daily. Take first 2 tablets together, then 1 every day until finished. 6 tablet Irene Mitcham A, PA-C      PDMP not reviewed this encounter.    [1]  Social History Tobacco Use   Smoking status: Every Day    Current packs/day: 0.50    Types: Cigarettes  Substance Use Topics   Alcohol use: Yes   Drug use: No     Jaan Fischel A, PA-C 01/21/24 1105  "
